# Patient Record
Sex: Female | Born: 1960 | Race: White | Hispanic: No | Marital: Married | Smoking: Former smoker
Health system: Southern US, Community
[De-identification: ages and names within clinical notes are randomized; demographics above are authoritative.]

## PROBLEM LIST (undated history)

## (undated) DIAGNOSIS — Z9109 Other allergy status, other than to drugs and biological substances: Secondary | ICD-10-CM

## (undated) DIAGNOSIS — N83209 Unspecified ovarian cyst, unspecified side: Secondary | ICD-10-CM

## (undated) DIAGNOSIS — M1990 Unspecified osteoarthritis, unspecified site: Secondary | ICD-10-CM

## (undated) DIAGNOSIS — IMO0002 Reserved for concepts with insufficient information to code with codable children: Secondary | ICD-10-CM

## (undated) HISTORY — PX: PELVIC LAPAROSCOPY: SHX162

## (undated) HISTORY — PX: NASAL SINUS SURGERY: SHX719

## (undated) HISTORY — DX: Unspecified osteoarthritis, unspecified site: M19.90

## (undated) HISTORY — PX: COSMETIC SURGERY: SHX468

## (undated) HISTORY — DX: Other allergy status, other than to drugs and biological substances: Z91.09

## (undated) HISTORY — DX: Unspecified ovarian cyst, unspecified side: N83.209

## (undated) HISTORY — PX: MRI: SHX5353

## (undated) HISTORY — PX: LEG SURGERY: SHX1003

## (undated) HISTORY — DX: Reserved for concepts with insufficient information to code with codable children: IMO0002

---

## 1999-05-25 ENCOUNTER — Other Ambulatory Visit: Admission: RE | Admit: 1999-05-25 | Discharge: 1999-05-25 | Payer: Self-pay | Admitting: Gynecology

## 2000-07-21 ENCOUNTER — Other Ambulatory Visit: Admission: RE | Admit: 2000-07-21 | Discharge: 2000-07-21 | Payer: Self-pay | Admitting: Obstetrics and Gynecology

## 2001-09-27 ENCOUNTER — Other Ambulatory Visit: Admission: RE | Admit: 2001-09-27 | Discharge: 2001-09-27 | Payer: Self-pay | Admitting: Obstetrics and Gynecology

## 2002-01-16 ENCOUNTER — Other Ambulatory Visit: Admission: RE | Admit: 2002-01-16 | Discharge: 2002-01-16 | Payer: Self-pay | Admitting: Obstetrics and Gynecology

## 2004-09-29 ENCOUNTER — Other Ambulatory Visit: Admission: RE | Admit: 2004-09-29 | Discharge: 2004-09-29 | Payer: Self-pay | Admitting: Obstetrics and Gynecology

## 2005-04-12 ENCOUNTER — Other Ambulatory Visit: Admission: RE | Admit: 2005-04-12 | Discharge: 2005-04-12 | Payer: Self-pay | Admitting: Obstetrics and Gynecology

## 2005-10-11 ENCOUNTER — Other Ambulatory Visit: Admission: RE | Admit: 2005-10-11 | Discharge: 2005-10-11 | Payer: Self-pay | Admitting: Obstetrics and Gynecology

## 2005-10-22 ENCOUNTER — Ambulatory Visit (HOSPITAL_COMMUNITY): Admission: RE | Admit: 2005-10-22 | Discharge: 2005-10-22 | Payer: Self-pay | Admitting: Obstetrics and Gynecology

## 2006-10-18 ENCOUNTER — Other Ambulatory Visit: Admission: RE | Admit: 2006-10-18 | Discharge: 2006-10-18 | Payer: Self-pay | Admitting: Obstetrics and Gynecology

## 2007-03-29 ENCOUNTER — Encounter: Admission: RE | Admit: 2007-03-29 | Discharge: 2007-03-29 | Payer: Self-pay | Admitting: Obstetrics and Gynecology

## 2008-07-16 ENCOUNTER — Other Ambulatory Visit: Admission: RE | Admit: 2008-07-16 | Discharge: 2008-07-16 | Payer: Self-pay | Admitting: Obstetrics and Gynecology

## 2008-07-16 ENCOUNTER — Encounter: Payer: Self-pay | Admitting: Obstetrics and Gynecology

## 2008-07-16 ENCOUNTER — Ambulatory Visit: Payer: Self-pay | Admitting: Obstetrics and Gynecology

## 2009-07-22 ENCOUNTER — Other Ambulatory Visit: Admission: RE | Admit: 2009-07-22 | Discharge: 2009-07-22 | Payer: Self-pay | Admitting: Obstetrics and Gynecology

## 2009-07-22 ENCOUNTER — Ambulatory Visit: Payer: Self-pay | Admitting: Obstetrics and Gynecology

## 2010-07-30 ENCOUNTER — Encounter
Admission: RE | Admit: 2010-07-30 | Discharge: 2010-07-30 | Payer: Self-pay | Source: Home / Self Care | Attending: Sports Medicine | Admitting: Sports Medicine

## 2010-08-04 ENCOUNTER — Encounter
Admission: RE | Admit: 2010-08-04 | Discharge: 2010-08-04 | Payer: Self-pay | Source: Home / Self Care | Attending: Obstetrics and Gynecology | Admitting: Obstetrics and Gynecology

## 2010-08-18 ENCOUNTER — Ambulatory Visit
Admission: RE | Admit: 2010-08-18 | Discharge: 2010-08-18 | Payer: Self-pay | Source: Home / Self Care | Attending: Obstetrics and Gynecology | Admitting: Obstetrics and Gynecology

## 2010-08-18 ENCOUNTER — Other Ambulatory Visit
Admission: RE | Admit: 2010-08-18 | Discharge: 2010-08-18 | Payer: Self-pay | Source: Home / Self Care | Admitting: Obstetrics and Gynecology

## 2010-08-18 ENCOUNTER — Other Ambulatory Visit: Payer: Self-pay | Admitting: Obstetrics and Gynecology

## 2010-08-22 ENCOUNTER — Other Ambulatory Visit: Payer: Self-pay | Admitting: Sports Medicine

## 2010-08-22 DIAGNOSIS — M25561 Pain in right knee: Secondary | ICD-10-CM

## 2010-10-03 ENCOUNTER — Ambulatory Visit
Admission: RE | Admit: 2010-10-03 | Discharge: 2010-10-03 | Disposition: A | Discharge status: Home / Self Care | Payer: Self-pay | Source: Ambulatory Visit | Attending: Sports Medicine | Admitting: Sports Medicine

## 2010-10-03 DIAGNOSIS — M25561 Pain in right knee: Secondary | ICD-10-CM

## 2011-08-27 ENCOUNTER — Other Ambulatory Visit: Payer: Self-pay | Admitting: Obstetrics and Gynecology

## 2011-08-27 DIAGNOSIS — Z1231 Encounter for screening mammogram for malignant neoplasm of breast: Secondary | ICD-10-CM

## 2011-08-31 ENCOUNTER — Ambulatory Visit
Admission: RE | Admit: 2011-08-31 | Discharge: 2011-08-31 | Disposition: A | Discharge status: Home / Self Care | Payer: Self-pay | Source: Ambulatory Visit | Attending: Obstetrics and Gynecology | Admitting: Obstetrics and Gynecology

## 2011-08-31 DIAGNOSIS — Z1231 Encounter for screening mammogram for malignant neoplasm of breast: Secondary | ICD-10-CM

## 2011-09-23 ENCOUNTER — Encounter: Payer: Self-pay | Admitting: Gynecology

## 2011-09-23 DIAGNOSIS — N83209 Unspecified ovarian cyst, unspecified side: Secondary | ICD-10-CM | POA: Insufficient documentation

## 2011-09-23 DIAGNOSIS — M1990 Unspecified osteoarthritis, unspecified site: Secondary | ICD-10-CM | POA: Insufficient documentation

## 2011-09-30 ENCOUNTER — Encounter: Payer: Self-pay | Admitting: Obstetrics and Gynecology

## 2011-09-30 ENCOUNTER — Other Ambulatory Visit (HOSPITAL_COMMUNITY)
Admission: RE | Admit: 2011-09-30 | Discharge: 2011-09-30 | Disposition: A | Discharge status: Home / Self Care | Payer: BC Managed Care – PPO | Source: Ambulatory Visit | Attending: Obstetrics and Gynecology | Admitting: Obstetrics and Gynecology

## 2011-09-30 ENCOUNTER — Ambulatory Visit (INDEPENDENT_AMBULATORY_CARE_PROVIDER_SITE_OTHER): Payer: BC Managed Care – PPO | Admitting: Obstetrics and Gynecology

## 2011-09-30 VITALS — BP 118/74 | Ht 67.0 in | Wt 108.0 lb

## 2011-09-30 DIAGNOSIS — Z01419 Encounter for gynecological examination (general) (routine) without abnormal findings: Secondary | ICD-10-CM

## 2011-09-30 DIAGNOSIS — S83209A Unspecified tear of unspecified meniscus, current injury, unspecified knee, initial encounter: Secondary | ICD-10-CM | POA: Insufficient documentation

## 2011-09-30 MED ORDER — AZITHROMYCIN 250 MG PO TABS: ORAL_TABLET | ORAL | Status: AC

## 2011-09-30 MED ORDER — NORETHINDRONE 0.35 MG PO TABS: ORAL_TABLET | ORAL | Status: DC

## 2011-09-30 NOTE — Progress Notes (Signed)
Patient came to see me today for her annual GYN exam. She remains on active continuous birth control pills. She has no bleeding. She is doing this both for period Control and birth control. She has been out of medication and is not taking any for 10 days but has not bled or had any symptoms. She is up-to-date on mammograms. She is now suffering from acute sinusitis with pain and nasal discharge. She is having no pelvic pain.  Physical examination: Kennon Portela present. HEENT within normal limits. Neck: Thyroid not large. No masses. Supraclavicular nodes: not enlarged. Breasts: Examined in both sitting midline position. No skin changes and no masses. Abdomen: Soft no guarding rebound or masses or hernia. Pelvic: External: Within normal limits. BUS: Within normal limits. Vaginal:within normal limits. Good estrogen effect. No evidence of cystocele rectocele or enterocele. Cervix: clean. Uterus: Normal size and shape. Adnexa: No masses. Rectovaginal exam: Confirmatory and negative. Extremities: Within normal limits.  Assessment: Normal GYN exam. Sinusitis.  Plan: Reinitiate OCPs. Take 1 daily of active continuous pills. Use condoms for birth control the first cycle. Z-Pak written for. Continue yearly mammograms. Patient wanted Sudafed but didn't know what dose she used. She will call with dose and we will give her 30 with 12 refills.

## 2011-10-01 LAB — CBC WITH DIFFERENTIAL/PLATELET
Eosinophils Absolute: 0.3 10*3/uL (ref 0.0–0.7)
Hemoglobin: 13.8 g/dL (ref 12.0–15.0)
Lymphocytes Relative: 22 % (ref 12–46)
Lymphs Abs: 1.9 10*3/uL (ref 0.7–4.0)
MCH: 32.5 pg (ref 26.0–34.0)
MCV: 99.3 fL (ref 78.0–100.0)
Monocytes Relative: 9 % (ref 3–12)
Neutrophils Relative %: 66 % (ref 43–77)
RBC: 4.25 MIL/uL (ref 3.87–5.11)

## 2011-10-01 LAB — URINALYSIS W MICROSCOPIC + REFLEX CULTURE
Protein, ur: NEGATIVE mg/dL
Urobilinogen, UA: 0.2 mg/dL (ref 0.0–1.0)

## 2011-10-01 LAB — CHOLESTEROL, TOTAL: Cholesterol: 191 mg/dL (ref 0–200)

## 2011-10-03 LAB — URINE CULTURE: Colony Count: 50000

## 2011-10-06 ENCOUNTER — Other Ambulatory Visit: Payer: Self-pay

## 2011-10-06 DIAGNOSIS — N390 Urinary tract infection, site not specified: Secondary | ICD-10-CM

## 2011-10-06 MED ORDER — NITROFURANTOIN MONOHYD MACRO 100 MG PO CAPS: 100.0000 mg | ORAL_CAPSULE | Freq: Two times a day (BID) | ORAL | Status: AC

## 2012-03-20 IMAGING — CR DG KNEE COMPLETE 4+V*R*
4 series · 4 of 4 positions shown · non-contrast
Comparison: None.

CLINICAL DATA: Right knee pain.  Swelling.

RIGHT KNEE - COMPLETE 4+ VIEW

[view not recorded (1 of 4)]
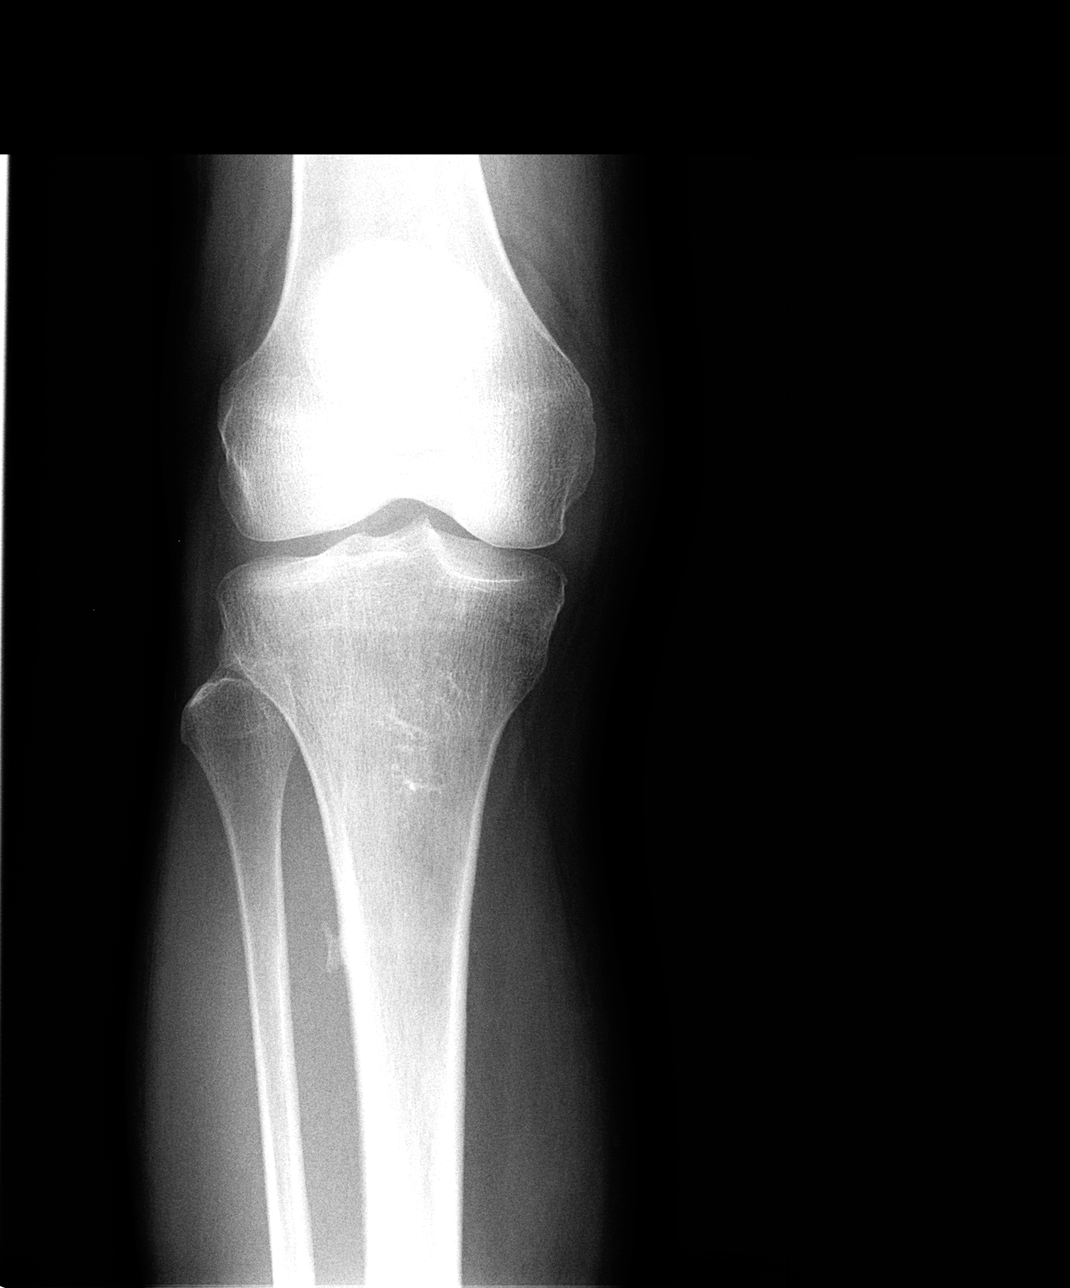

[view not recorded (2 of 4)]
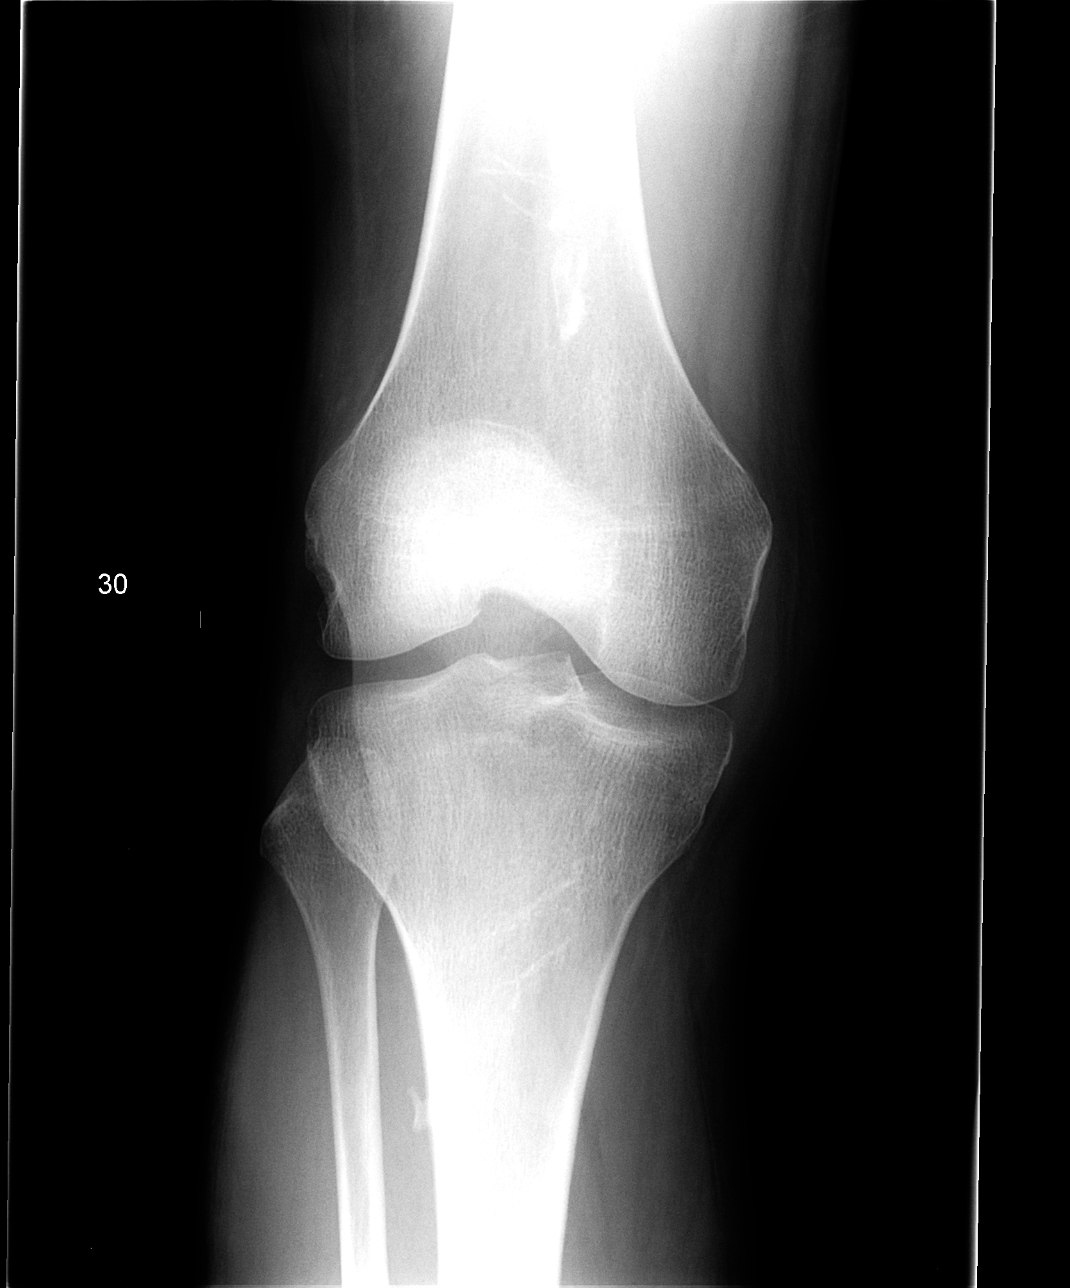

[view not recorded (3 of 4)]
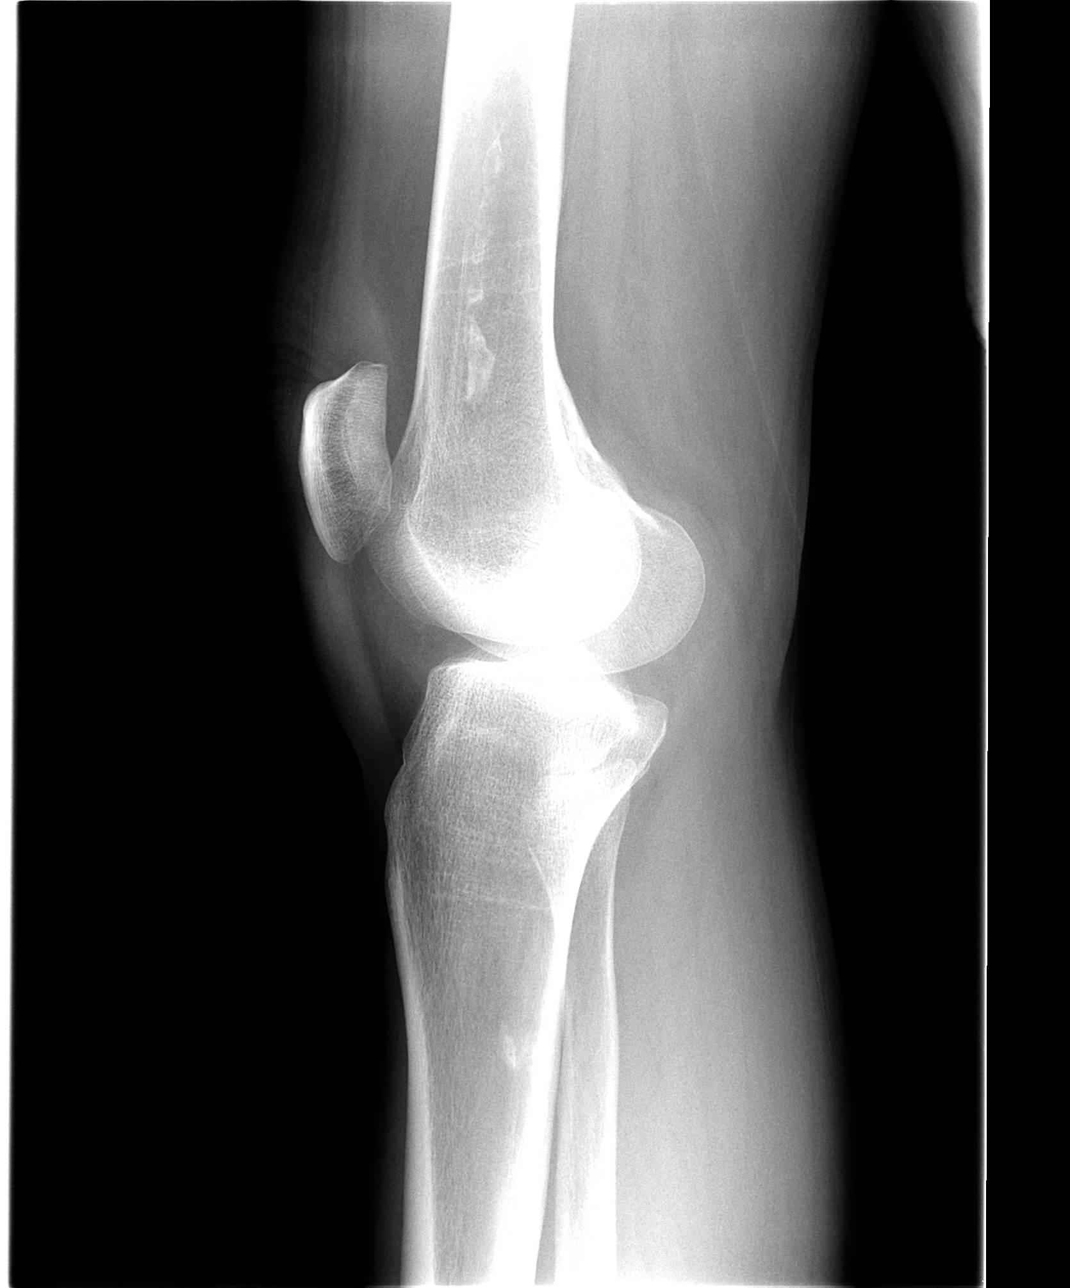

[view not recorded (4 of 4)]
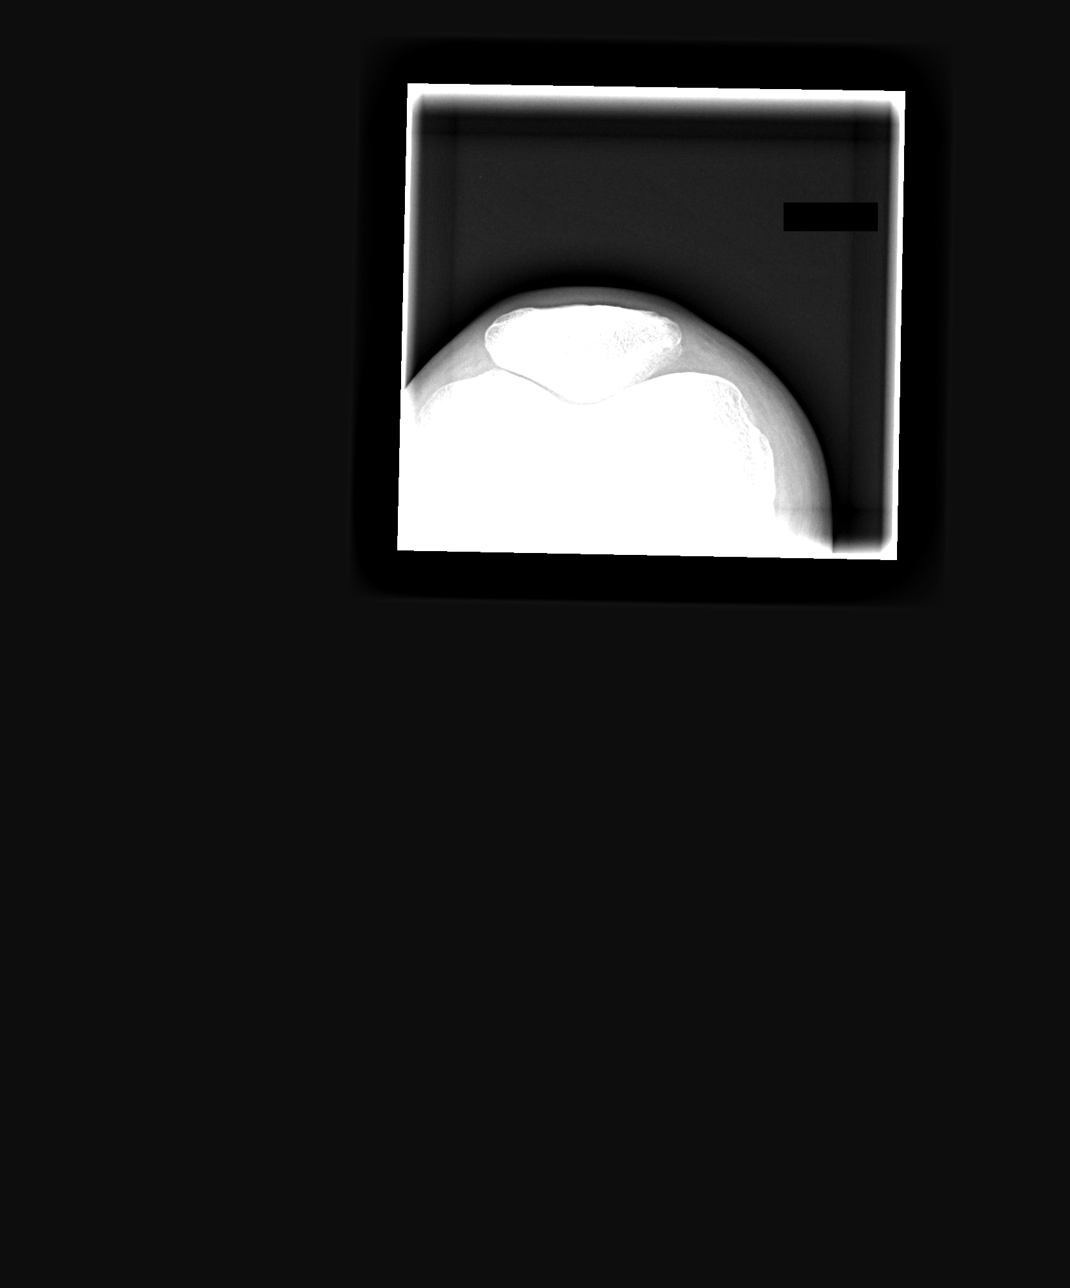

[4 of 4 positions shown; findings below may reference images not displayed]

FINDINGS: There are no erosive or destructive changes.  The joint
space is normally maintained.  There is no evidence for joint
effusion.  There is sclerotic change seen within the distal femoral
shaft and proximal tibia which may be seen secondary to old areas
of previous intramedullary infarction.
IMPRESSION: No acute findings.  Sclerotic areas within the distal right femur
and proximal right tibia most likely secondary to areas of previous
intramedullary infarction.

## 2012-10-04 ENCOUNTER — Other Ambulatory Visit: Payer: Self-pay | Admitting: Obstetrics and Gynecology

## 2012-10-16 ENCOUNTER — Encounter: Payer: Self-pay | Admitting: Women's Health

## 2012-10-16 ENCOUNTER — Ambulatory Visit: Payer: BC Managed Care – PPO | Admitting: Women's Health

## 2012-10-16 VITALS — BP 120/78 | Ht 66.0 in | Wt 111.0 lb

## 2012-10-16 DIAGNOSIS — IMO0001 Reserved for inherently not codable concepts without codable children: Secondary | ICD-10-CM

## 2012-10-16 DIAGNOSIS — E079 Disorder of thyroid, unspecified: Secondary | ICD-10-CM

## 2012-10-16 DIAGNOSIS — Z01419 Encounter for gynecological examination (general) (routine) without abnormal findings: Secondary | ICD-10-CM

## 2012-10-16 DIAGNOSIS — Z833 Family history of diabetes mellitus: Secondary | ICD-10-CM

## 2012-10-16 LAB — CBC WITH DIFFERENTIAL/PLATELET
Basophils Absolute: 0 10*3/uL (ref 0.0–0.1)
Basophils Relative: 0 % (ref 0–1)
HCT: 40.1 % (ref 36.0–46.0)
Lymphocytes Relative: 20 % (ref 12–46)
Neutro Abs: 6.8 10*3/uL (ref 1.7–7.7)
Neutrophils Relative %: 67 % (ref 43–77)
Platelets: 478 10*3/uL — ABNORMAL HIGH (ref 150–400)
RDW: 13.9 % (ref 11.5–15.5)
WBC: 10.4 10*3/uL (ref 4.0–10.5)

## 2012-10-16 MED ORDER — NORETHINDRONE 0.35 MG PO TABS: 1.0000 | ORAL_TABLET | Freq: Every day | ORAL | Status: DC

## 2012-10-16 NOTE — Progress Notes (Signed)
Summer N Spinney 1948-09-30 [id number]    History:    The patient presents for annual exam.  Rare bleeding on Micronor without complaint. Denies menopausal symptoms. History of normal 2 mammograms. History of ascus 2003 with negative colposcopy and biopsy. Normal Paps after   Past medical history, past surgical history, family history and social history were all reviewed and documented in the EPIC chart. 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 [place] 17 doing well. Normal lipid panel past year. Parents hypertension. Arthritis.   ROS:  A  ROS was performed and pertinent positives and negatives are included in the history.  Exam:  Filed Vitals:   10/16/12 1152  BP: 120/78    General appearance:  Normal Head/Neck:  Normal, without cervical or supraclavicular adenopathy. Thyroid:  Symmetrical, normal in size, without palpable masses or nodularity. Respiratory  Effort:  Normal  Auscultation:  Clear without wheezing or rhonchi Cardiovascular  Auscultation:  Regular rate, without rubs, murmurs or gallops  Edema/varicosities:  Not grossly evident Abdominal  Soft,nontender, without masses, guarding or rebound.  Liver/spleen:  No organomegaly noted  Hernia:  None appreciated  Skin  Inspection:  Grossly normal  Palpation:  Grossly normal Neurologic/psychiatric  Orientation:  Normal with appropriate conversation.  Mood/affect:  Normal  Genitourinary    Breasts: Examined lying and sitting.     Right: Without masses, retractions, discharge or axillary adenopathy.     Left: Without masses, retractions, discharge or axillary adenopathy.   Inguinal/mons:  Normal without inguinal adenopathy  External genitalia:  Normal  BUS/Urethra/Skene's glands:  Normal  Bladder:  Normal  Vagina:  Normal  Cervix:  Normal  Uterus:   normal in size, shape and contour.  Midline and mobile  Adnexa/parametria:     Rt: Without masses or tenderness.   Lt: Without masses or tenderness.  Anus and  perineum: Normal  Digital rectal exam: Normal sphincter tone without palpated masses or tenderness  Assessment/Plan:  52 y.o. MWF G7P2 for annual exam with no complaints.  Normal GYN exam on Micronor with rare bleeding Ascus with negative colposcopy and biopsy 2003 normal Paps after  Plan: SBE's, continue annual mammogram, calcium rich diet, vitamin D 1000 daily and regular exercise encouraged. Micronor prescription, proper use given and reviewed. CBC, glucose, UA, normal lipid panel 2013, Pap normal 10/2011, new screening guidelines reviewed.    Harrington Challenger WHNP, 1:26 PM 10/16/2012

## 2012-10-16 NOTE — Patient Instructions (Signed)

## 2012-10-17 LAB — URINALYSIS W MICROSCOPIC + REFLEX CULTURE
Bacteria, UA: NONE SEEN
Hgb urine dipstick: NEGATIVE
Ketones, ur: NEGATIVE mg/dL
Leukocytes, UA: NEGATIVE
Nitrite: NEGATIVE
Protein, ur: NEGATIVE mg/dL

## 2012-10-17 LAB — GLUCOSE, RANDOM: Glucose, Bld: 61 mg/dL — ABNORMAL LOW (ref 70–99)

## 2012-10-17 LAB — TSH: TSH: 0.746 u[IU]/mL (ref 0.350–4.500)

## 2012-10-19 ENCOUNTER — Other Ambulatory Visit: Payer: Self-pay | Admitting: Women's Health

## 2012-10-19 DIAGNOSIS — R7989 Other specified abnormal findings of blood chemistry: Secondary | ICD-10-CM

## 2012-11-09 ENCOUNTER — Other Ambulatory Visit: Payer: Self-pay | Admitting: Obstetrics and Gynecology

## 2012-11-09 DIAGNOSIS — Z1231 Encounter for screening mammogram for malignant neoplasm of breast: Secondary | ICD-10-CM

## 2012-11-14 ENCOUNTER — Ambulatory Visit (INDEPENDENT_AMBULATORY_CARE_PROVIDER_SITE_OTHER): Payer: BC Managed Care – PPO

## 2012-11-14 DIAGNOSIS — Z1231 Encounter for screening mammogram for malignant neoplasm of breast: Secondary | ICD-10-CM

## 2012-11-16 ENCOUNTER — Other Ambulatory Visit: Payer: BC Managed Care – PPO

## 2012-11-16 DIAGNOSIS — R7989 Other specified abnormal findings of blood chemistry: Secondary | ICD-10-CM

## 2012-11-17 LAB — CBC WITH DIFFERENTIAL/PLATELET
Basophils Absolute: 0 10*3/uL (ref 0.0–0.1)
Eosinophils Relative: 1 % (ref 0–5)
HCT: 38.4 % (ref 36.0–46.0)
Lymphocytes Relative: 34 % (ref 12–46)
Lymphs Abs: 3.6 10*3/uL (ref 0.7–4.0)
MCV: 95.3 fL (ref 78.0–100.0)
Monocytes Absolute: 0.8 10*3/uL (ref 0.1–1.0)
Neutro Abs: 6.1 10*3/uL (ref 1.7–7.7)
Platelets: 525 10*3/uL — ABNORMAL HIGH (ref 150–400)
RBC: 4.03 MIL/uL (ref 3.87–5.11)
RDW: 13.9 % (ref 11.5–15.5)
WBC: 10.5 10*3/uL (ref 4.0–10.5)

## 2012-11-22 ENCOUNTER — Telehealth: Payer: Self-pay | Admitting: Oncology

## 2012-11-22 ENCOUNTER — Telehealth: Payer: Self-pay

## 2012-11-22 ENCOUNTER — Telehealth: Payer: Self-pay | Admitting: *Deleted

## 2012-11-22 NOTE — Telephone Encounter (Signed)
S/W PT IN RE TO NP APPT 05/20 @ 10:30 W/DR. SHADAD. REFERRING NANCY YOUNG, NP DX-ELEVATED PLTS WELCOME PACKET MAILED.

## 2012-11-22 NOTE — Telephone Encounter (Signed)
Message copied by Aura Camps on Wed Nov 22, 2012  9:42 AM ------      Message from: Keenan Bachelor      Created: Tue Nov 21, 2012  4:48 PM      Regarding: Referral Hematologist       Please call and inform platelets are now a little higher, will need followup with hematologist. Dr Myna Hidalgo or any of the others is ok.  Review probably nothing , but best to have checked. (She is healthy with no known medical problems)               Victorino Dike, I left patient detailed message with above and told her you would be in touch with her regarding hematologist appt.            Thanks!!!!!!!!!!! ------

## 2012-11-22 NOTE — Telephone Encounter (Signed)
LVOM FOR PT TO RETURN CALL.  °

## 2012-11-22 NOTE — Telephone Encounter (Signed)
Referral placed to [facility].

## 2012-11-22 NOTE — Telephone Encounter (Signed)
Message left

## 2012-11-22 NOTE — Telephone Encounter (Signed)
Patient was informed regarding platelets still being elevated and was reassured.  Hematology appt is May 20th but she would still would like to talk with you.

## 2012-11-22 NOTE — Telephone Encounter (Signed)
appt 12/19/12

## 2012-11-23 NOTE — Telephone Encounter (Signed)
Patient had called back, message left,

## 2012-11-24 NOTE — Telephone Encounter (Signed)
Telephone call to review platelets. Informed me that she is a vegetarian, eats minimal dairy other than occasional cheese. Encouraged to start B complex vitamin supplement daily and inform hematologist at followup appointment for elevated platelets.

## 2012-11-28 ENCOUNTER — Telehealth: Payer: Self-pay | Admitting: Oncology

## 2012-11-28 NOTE — Telephone Encounter (Signed)
C/D 11/28/12 for appt. 12/19/12

## 2012-12-06 ENCOUNTER — Telehealth: Payer: Self-pay | Admitting: Women's Health

## 2012-12-06 DIAGNOSIS — D691 Qualitative platelet defects: Secondary | ICD-10-CM

## 2012-12-06 NOTE — Telephone Encounter (Signed)
Telephone call from patient. Has had increased platelets. Remembered that she had taken 2 weeks of prednisone prior to her office appointment/sinus infection and questions if that may have elevated her platelets. Has a scheduled hematologist appointment on may the 20th and is requesting to repeat her CBC prior. We'll recheck CBC, triage based on those results, switched to appointments. Reviewed steroids may increase platelets.

## 2012-12-13 ENCOUNTER — Other Ambulatory Visit: Payer: Self-pay | Admitting: Women's Health

## 2012-12-13 ENCOUNTER — Other Ambulatory Visit: Payer: BC Managed Care – PPO

## 2012-12-13 DIAGNOSIS — D691 Qualitative platelet defects: Secondary | ICD-10-CM

## 2012-12-13 LAB — CBC WITH DIFFERENTIAL/PLATELET
HCT: 36.1 % (ref 36.0–46.0)
Hemoglobin: 12.7 g/dL (ref 12.0–15.0)
Lymphs Abs: 2.1 10*3/uL (ref 0.7–4.0)
MCHC: 35.2 g/dL (ref 30.0–36.0)
Monocytes Absolute: 0.5 10*3/uL (ref 0.1–1.0)
Monocytes Relative: 8 % (ref 3–12)
Neutro Abs: 3.8 10*3/uL (ref 1.7–7.7)
Neutrophils Relative %: 58 % (ref 43–77)
RBC: 3.86 MIL/uL — ABNORMAL LOW (ref 3.87–5.11)

## 2012-12-14 ENCOUNTER — Other Ambulatory Visit: Payer: Self-pay | Admitting: Women's Health

## 2012-12-14 DIAGNOSIS — D691 Qualitative platelet defects: Secondary | ICD-10-CM

## 2012-12-15 ENCOUNTER — Other Ambulatory Visit: Payer: Self-pay | Admitting: Oncology

## 2012-12-15 DIAGNOSIS — D75839 Thrombocytosis, unspecified: Secondary | ICD-10-CM

## 2012-12-15 DIAGNOSIS — D473 Essential (hemorrhagic) thrombocythemia: Secondary | ICD-10-CM

## 2012-12-19 ENCOUNTER — Ambulatory Visit: Payer: BC Managed Care – PPO | Admitting: Oncology

## 2012-12-19 ENCOUNTER — Ambulatory Visit: Payer: BC Managed Care – PPO

## 2012-12-19 ENCOUNTER — Other Ambulatory Visit: Payer: BC Managed Care – PPO | Admitting: Lab

## 2013-06-07 ENCOUNTER — Other Ambulatory Visit: Payer: Self-pay

## 2013-10-17 ENCOUNTER — Ambulatory Visit (INDEPENDENT_AMBULATORY_CARE_PROVIDER_SITE_OTHER): Payer: BC Managed Care – PPO | Admitting: Women's Health

## 2013-10-17 ENCOUNTER — Encounter: Payer: Self-pay | Admitting: Women's Health

## 2013-10-17 ENCOUNTER — Other Ambulatory Visit (HOSPITAL_COMMUNITY)
Admission: RE | Admit: 2013-10-17 | Discharge: 2013-10-17 | Disposition: A | Discharge status: Home / Self Care | Payer: BC Managed Care – PPO | Source: Ambulatory Visit | Attending: Women's Health | Admitting: Women's Health

## 2013-10-17 VITALS — BP 110/70 | Ht 65.5 in | Wt 118.0 lb

## 2013-10-17 DIAGNOSIS — Z01419 Encounter for gynecological examination (general) (routine) without abnormal findings: Secondary | ICD-10-CM

## 2013-10-17 DIAGNOSIS — D691 Qualitative platelet defects: Secondary | ICD-10-CM

## 2013-10-17 DIAGNOSIS — D75839 Thrombocytosis, unspecified: Secondary | ICD-10-CM

## 2013-10-17 DIAGNOSIS — R87810 Cervical high risk human papillomavirus (HPV) DNA test positive: Secondary | ICD-10-CM | POA: Insufficient documentation

## 2013-10-17 DIAGNOSIS — Z309 Encounter for contraceptive management, unspecified: Secondary | ICD-10-CM

## 2013-10-17 DIAGNOSIS — D473 Essential (hemorrhagic) thrombocythemia: Secondary | ICD-10-CM

## 2013-10-17 DIAGNOSIS — Z1151 Encounter for screening for human papillomavirus (HPV): Secondary | ICD-10-CM | POA: Insufficient documentation

## 2013-10-17 DIAGNOSIS — IMO0001 Reserved for inherently not codable concepts without codable children: Secondary | ICD-10-CM

## 2013-10-17 DIAGNOSIS — Z124 Encounter for screening for malignant neoplasm of cervix: Secondary | ICD-10-CM | POA: Insufficient documentation

## 2013-10-17 LAB — CBC WITH DIFFERENTIAL/PLATELET
BASOS ABS: 0 10*3/uL (ref 0.0–0.1)
Basophils Relative: 0 % (ref 0–1)
Eosinophils Absolute: 0.1 10*3/uL (ref 0.0–0.7)
Eosinophils Relative: 1 % (ref 0–5)
HEMATOCRIT: 37.9 % (ref 36.0–46.0)
HEMOGLOBIN: 13.2 g/dL (ref 12.0–15.0)
LYMPHS PCT: 32 % (ref 12–46)
Lymphs Abs: 2.2 10*3/uL (ref 0.7–4.0)
MCH: 32.4 pg (ref 26.0–34.0)
MCHC: 34.8 g/dL (ref 30.0–36.0)
MCV: 92.9 fL (ref 78.0–100.0)
MONO ABS: 0.7 10*3/uL (ref 0.1–1.0)
MONOS PCT: 10 % (ref 3–12)
NEUTROS ABS: 3.9 10*3/uL (ref 1.7–7.7)
NEUTROS PCT: 57 % (ref 43–77)
Platelets: 405 10*3/uL — ABNORMAL HIGH (ref 150–400)
RBC: 4.08 MIL/uL (ref 3.87–5.11)
RDW: 13.3 % (ref 11.5–15.5)
WBC: 6.8 10*3/uL (ref 4.0–10.5)

## 2013-10-17 MED ORDER — NORETHINDRONE 0.35 MG PO TABS: 1.0000 | ORAL_TABLET | Freq: Every day | ORAL | Status: DC

## 2013-10-17 NOTE — Progress Notes (Signed)
Shelley Valencia [birth year] [id number]    History:    Presents for annual exam.  Rare spotting on Micronor with no menopausal symptoms. Mother and sisters menopause middle 52s. 2003 ascus with negative colposcopy. Has not had a colonoscopy. Normal mammogram history. RA.  Past medical history, past surgical history, family history and social history were all reviewed and documented in the EPIC chart. 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 Shelley Valencia 17 doing well. Parents hypertension. Sister cervical cancer.  ROS:  A  ROS was performed and pertinent positives and negatives are included.  Exam:  Filed Vitals:   10/17/13 1437  BP: 110/70    General appearance:  Normal Thyroid:  Symmetrical, normal in size, without palpable masses or nodularity. Respiratory  Auscultation:  Clear without wheezing or rhonchi Cardiovascular  Auscultation:  Regular rate, without rubs, murmurs or gallops  Edema/varicosities:  Not grossly evident Abdominal  Soft,nontender, without masses, guarding or rebound.  Liver/spleen:  No organomegaly noted  Hernia:  None appreciated  Skin  Inspection:  Grossly normal   Breasts: Examined lying and sitting.     Right: Without masses, retractions, discharge or axillary adenopathy.     Left: Without masses, retractions, discharge or axillary adenopathy. Gentitourinary   Inguinal/mons:  Normal without inguinal adenopathy  External genitalia:  Normal  BUS/Urethra/Skene's glands:  Normal  Vagina:  Normal  Cervix:  Normal  Uterus:   normal in size, shape and contour.  Midline and mobile  Adnexa/parametria:     Rt: Without masses or tenderness.   Lt: Without masses or tenderness.  Anus and perineum: Normal  Digital rectal exam: Normal sphincter tone without palpated masses or tenderness  Assessment/Plan:  53 y.o. MWF G2P2 for annual exam with no complaints.  2003 ascus with normal Paps after Arthritis Amenorrheic with Micronor  Plan: Micronor prescription, proper  use, slight risk for blood clots and strokes. SBE's, continue annual mammogram, 3-D tomography reviewed and encouraged history of dense breast. Continue active lifestyle, regular exercise, calcium rich diet,  vitamin D 2000 daily. CBC, UA, Pap. Pap normal 08/2011, new screening guidelines reviewed. Normal lipid panel and TSH 2014. Reviewed importance of screening colonoscopy instructed to schedule.  Shelley Valencia [facility], 3:37 PM 10/17/2013

## 2013-10-17 NOTE — Addendum Note (Signed)
Addended by: Alen Blew on: 10/17/2013 04:51 PM   Modules accepted: Orders

## 2013-10-18 ENCOUNTER — Encounter: Payer: Self-pay | Admitting: Women's Health

## 2013-10-18 LAB — URINALYSIS W MICROSCOPIC + REFLEX CULTURE
BACTERIA UA: NONE SEEN
Bilirubin Urine: NEGATIVE
Casts: NONE SEEN
Crystals: NONE SEEN
Glucose, UA: NEGATIVE mg/dL
HGB URINE DIPSTICK: NEGATIVE
KETONES UR: NEGATIVE mg/dL
Leukocytes, UA: NEGATIVE
NITRITE: NEGATIVE
Protein, ur: NEGATIVE mg/dL
Specific Gravity, Urine: 1.011 (ref 1.005–1.030)
Squamous Epithelial / LPF: NONE SEEN
UROBILINOGEN UA: 0.2 mg/dL (ref 0.0–1.0)
pH: 6.5 (ref 5.0–8.0)

## 2013-10-24 ENCOUNTER — Other Ambulatory Visit: Payer: Self-pay | Admitting: Women's Health

## 2013-10-24 DIAGNOSIS — Z1231 Encounter for screening mammogram for malignant neoplasm of breast: Secondary | ICD-10-CM

## 2013-10-31 DIAGNOSIS — IMO0002 Reserved for concepts with insufficient information to code with codable children: Secondary | ICD-10-CM

## 2013-10-31 HISTORY — DX: Reserved for concepts with insufficient information to code with codable children: IMO0002

## 2013-11-15 ENCOUNTER — Ambulatory Visit (INDEPENDENT_AMBULATORY_CARE_PROVIDER_SITE_OTHER): Payer: BC Managed Care – PPO

## 2013-11-15 DIAGNOSIS — Z1231 Encounter for screening mammogram for malignant neoplasm of breast: Secondary | ICD-10-CM

## 2013-11-18 ENCOUNTER — Encounter: Payer: Self-pay | Admitting: Women's Health

## 2013-11-21 ENCOUNTER — Ambulatory Visit (INDEPENDENT_AMBULATORY_CARE_PROVIDER_SITE_OTHER): Payer: BC Managed Care – PPO | Admitting: Gynecology

## 2013-11-21 ENCOUNTER — Encounter: Payer: Self-pay | Admitting: Gynecology

## 2013-11-21 DIAGNOSIS — R6889 Other general symptoms and signs: Secondary | ICD-10-CM

## 2013-11-21 DIAGNOSIS — IMO0002 Reserved for concepts with insufficient information to code with codable children: Secondary | ICD-10-CM

## 2013-11-21 NOTE — Addendum Note (Signed)
Addended by: Nelva Nay on: 11/21/2013 04:36 PM   Modules accepted: Orders

## 2013-11-21 NOTE — Patient Instructions (Signed)
Office will call you with the biopsy results 

## 2013-11-21 NOTE — Progress Notes (Signed)
Patient ID: Shelley Valencia, female   DOB: 07-06-1961, 53 y.o.   MRN: [id number] Shelley Valencia [birth year] [id number]        53 y.o.  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 Per Nancy's note she has a history of ASCUS in 2003 with negative Pap smears since then.  Past medical history,surgical history, problem list, medications, allergies, family history and social history were all reviewed and documented in the EPIC chart.  Directed ROS with pertinent positives and negatives documented in the history of present illness/assessment and plan.  Exam: [name] assistant General appearance  Normal Pelvic external BUS vagina normal. Cervix grossly normal. Uterus normal size midline mobile nontender. Adnexa without masses or tenderness.  Colposcopy after acetic acid cleanse is inadequate. Transformation zone not seen in its entirety. No abnormalities were seen. Patient has a main cervical os and a smaller dimpled os to the left. Probing with a Cytobrush showed communication with the other main loss/canal. ECC sampling of both openings obtained.Physical Exam  Genitourinary:       Assessment/Plan:  53 y.o. 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 No significant history prior. Colposcopy was normal but inadequate . Has a dimpling cervical opening lateral to the main cervical os. Suspect secondary to obstetrical trauma/healing. Sampling of both areas taken at [facility]. Patient will followup for results. If negative then plan expectant management with repeat Pap/HPV in 1 year. If otherwise then we will triage based on results.    Note: This document was prepared with digital dictation and possible smart phrase technology. Any transcriptional errors that result from this process are unintentional.   Anastasio Auerbach MD, 4:23 PM 11/21/2013

## 2013-11-26 ENCOUNTER — Encounter: Payer: Self-pay | Admitting: Gynecology

## 2014-06-03 ENCOUNTER — Encounter: Payer: Self-pay | Admitting: Gynecology

## 2014-10-16 ENCOUNTER — Other Ambulatory Visit: Payer: Self-pay | Admitting: Women's Health

## 2014-10-16 ENCOUNTER — Telehealth: Payer: Self-pay | Admitting: *Deleted

## 2014-10-16 ENCOUNTER — Other Ambulatory Visit: Payer: Self-pay

## 2014-10-16 DIAGNOSIS — Z1231 Encounter for screening mammogram for malignant neoplasm of breast: Secondary | ICD-10-CM

## 2014-10-16 NOTE — Telephone Encounter (Signed)
Pt called requesting name of place to have 3 D mammogram breast name and # given.

## 2014-10-24 ENCOUNTER — Encounter: Payer: Self-pay | Admitting: Women's Health

## 2014-10-24 ENCOUNTER — Ambulatory Visit (INDEPENDENT_AMBULATORY_CARE_PROVIDER_SITE_OTHER): Payer: BLUE CROSS/BLUE SHIELD | Admitting: Women's Health

## 2014-10-24 ENCOUNTER — Other Ambulatory Visit (HOSPITAL_COMMUNITY)
Admission: RE | Admit: 2014-10-24 | Discharge: 2014-10-24 | Disposition: A | Discharge status: Home / Self Care | Payer: BLUE CROSS/BLUE SHIELD | Source: Ambulatory Visit | Attending: Women's Health | Admitting: Women's Health

## 2014-10-24 VITALS — BP 112/72 | Ht 66.0 in | Wt 113.4 lb

## 2014-10-24 DIAGNOSIS — Z1151 Encounter for screening for human papillomavirus (HPV): Secondary | ICD-10-CM | POA: Diagnosis present

## 2014-10-24 DIAGNOSIS — Z01419 Encounter for gynecological examination (general) (routine) without abnormal findings: Secondary | ICD-10-CM | POA: Diagnosis present

## 2014-10-24 DIAGNOSIS — Z3041 Encounter for surveillance of contraceptive pills: Secondary | ICD-10-CM | POA: Diagnosis not present

## 2014-10-24 DIAGNOSIS — Z13220 Encounter for screening for lipoid disorders: Secondary | ICD-10-CM

## 2014-10-24 LAB — COMPREHENSIVE METABOLIC PANEL
ALBUMIN: 4.5 g/dL (ref 3.5–5.2)
ALT: 25 U/L (ref 0–35)
AST: 30 U/L (ref 0–37)
Alkaline Phosphatase: 54 U/L (ref 39–117)
BUN: 7 mg/dL (ref 6–23)
CALCIUM: 9.8 mg/dL (ref 8.4–10.5)
CHLORIDE: 96 meq/L (ref 96–112)
CO2: 27 meq/L (ref 19–32)
CREATININE: 0.65 mg/dL (ref 0.50–1.10)
GLUCOSE: 93 mg/dL (ref 70–99)
POTASSIUM: 4.4 meq/L (ref 3.5–5.3)
Sodium: 133 mEq/L — ABNORMAL LOW (ref 135–145)
Total Bilirubin: 0.8 mg/dL (ref 0.2–1.2)
Total Protein: 7.2 g/dL (ref 6.0–8.3)

## 2014-10-24 LAB — CBC WITH DIFFERENTIAL/PLATELET
BASOS ABS: 0 10*3/uL (ref 0.0–0.1)
BASOS PCT: 0 % (ref 0–1)
EOS PCT: 1 % (ref 0–5)
Eosinophils Absolute: 0.1 10*3/uL (ref 0.0–0.7)
HCT: 42.6 % (ref 36.0–46.0)
Hemoglobin: 14.3 g/dL (ref 12.0–15.0)
Lymphocytes Relative: 22 % (ref 12–46)
Lymphs Abs: 2.2 10*3/uL (ref 0.7–4.0)
MCH: 32.6 pg (ref 26.0–34.0)
MCHC: 33.6 g/dL (ref 30.0–36.0)
MCV: 97.3 fL (ref 78.0–100.0)
MONO ABS: 0.9 10*3/uL (ref 0.1–1.0)
MPV: 9.1 fL (ref 8.6–12.4)
Monocytes Relative: 9 % (ref 3–12)
Neutro Abs: 6.8 10*3/uL (ref 1.7–7.7)
Neutrophils Relative %: 68 % (ref 43–77)
PLATELETS: 439 10*3/uL — AB (ref 150–400)
RBC: 4.38 MIL/uL (ref 3.87–5.11)
RDW: 13.1 % (ref 11.5–15.5)
WBC: 10 10*3/uL (ref 4.0–10.5)

## 2014-10-24 LAB — LIPID PANEL
CHOL/HDL RATIO: 2.4 ratio
Cholesterol: 213 mg/dL — ABNORMAL HIGH (ref 0–200)
HDL: 89 mg/dL (ref >=46)
LDL CALC: 106 mg/dL — AB (ref 0–99)
TRIGLYCERIDES: 92 mg/dL (ref <150)
VLDL: 18 mg/dL (ref 0–40)

## 2014-10-24 MED ORDER — NORETHINDRONE 0.35 MG PO TABS: 1.0000 | ORAL_TABLET | Freq: Every day | ORAL | Status: DC

## 2014-10-24 NOTE — Patient Instructions (Signed)

## 2014-10-24 NOTE — Progress Notes (Signed)
Shelley Valencia [birth year] [id number]    History:    Presents for annual exam.  Rare light bleeding on Micronor with occasional hot flushes. 2015 Pap ascus with positive HR HPV with negative colposcopy. Normal mammogram history. Reports both mother and sister middle 27s with menopause. Has scheduled appointment to discuss colonoscopy.  Past medical history, past surgical history, family history and social history were all reviewed and documented in the EPIC chart. 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 Parents hypertension.  ROS:  A ROS was performed and pertinent positives and negatives are included.  Exam:  Filed Vitals:   10/24/14 1425  BP: 112/72    General appearance:  Normal Thyroid:  Symmetrical, normal in size, without palpable masses or nodularity. Respiratory  Auscultation:  Clear without wheezing or rhonchi Cardiovascular  Auscultation:  Regular rate, without rubs, murmurs or gallops  Edema/varicosities:  Not grossly evident Abdominal  Soft,nontender, without masses, guarding or rebound.  Liver/spleen:  No organomegaly noted  Hernia:  None appreciated  Skin  Inspection:  Grossly normal   Breasts: Examined lying and sitting.     Right: Without masses, retractions, discharge or axillary adenopathy.     Left: Without masses, retractions, discharge or axillary adenopathy. Gentitourinary   Inguinal/mons:  Normal without inguinal adenopathy  External genitalia:  Normal  BUS/Urethra/Skene's glands:  Normal  Vagina:  Normal  Cervix:  Normal  Uterus:   normal in size, shape and contour.  Midline and mobile  Adnexa/parametria:     Rt: Without masses or tenderness.   Lt: Without masses or tenderness.  Anus and perineum: Normal  Digital rectal exam: Normal sphincter tone without palpated masses or tenderness  Assessment/Plan:  54 y.o. M WF G2 P2 for annual exam with no complaints.  Light monthly cycle on Micronor 2015 positive HR HPV with  negative colposcopy  Plan: Micronor prescription, proper use, slight risk for blood clots and strokes,. Continue active lifestyle, regular exercise, vitamin D 2000 daily encouraged. SBE's, continue annual screening mammogram 3-D tomography reviewed and encouraged history of dense breasts. CBC, CMP, TSH, vitamin D, lipid panel, UA, Pap with HR HPV typing.  Huel Cote [facility], 5:08 PM 10/24/2014

## 2014-10-25 LAB — VITAMIN D 25 HYDROXY (VIT D DEFICIENCY, FRACTURES): Vit D, 25-Hydroxy: 37 ng/mL (ref 30–100)

## 2014-10-25 LAB — TSH: TSH: 0.499 u[IU]/mL (ref 0.350–4.500)

## 2014-10-30 LAB — CYTOLOGY - PAP

## 2014-11-20 ENCOUNTER — Ambulatory Visit (INDEPENDENT_AMBULATORY_CARE_PROVIDER_SITE_OTHER): Payer: BLUE CROSS/BLUE SHIELD

## 2014-11-20 ENCOUNTER — Encounter: Payer: Self-pay | Admitting: Women's Health

## 2014-11-20 DIAGNOSIS — Z1231 Encounter for screening mammogram for malignant neoplasm of breast: Secondary | ICD-10-CM | POA: Diagnosis not present

## 2015-10-13 ENCOUNTER — Other Ambulatory Visit: Payer: Self-pay | Admitting: Women's Health

## 2015-10-13 DIAGNOSIS — Z1231 Encounter for screening mammogram for malignant neoplasm of breast: Secondary | ICD-10-CM

## 2015-10-28 ENCOUNTER — Encounter: Payer: BLUE CROSS/BLUE SHIELD | Admitting: Women's Health

## 2015-11-05 ENCOUNTER — Ambulatory Visit (INDEPENDENT_AMBULATORY_CARE_PROVIDER_SITE_OTHER): Payer: BLUE CROSS/BLUE SHIELD | Admitting: Women's Health

## 2015-11-05 ENCOUNTER — Other Ambulatory Visit: Payer: Self-pay | Admitting: Women's Health

## 2015-11-05 ENCOUNTER — Encounter: Payer: Self-pay | Admitting: Women's Health

## 2015-11-05 VITALS — BP 115/78 | Ht 65.0 in | Wt 113.0 lb

## 2015-11-05 DIAGNOSIS — Z01419 Encounter for gynecological examination (general) (routine) without abnormal findings: Secondary | ICD-10-CM | POA: Diagnosis not present

## 2015-11-05 DIAGNOSIS — Z13220 Encounter for screening for lipoid disorders: Secondary | ICD-10-CM | POA: Diagnosis not present

## 2015-11-05 DIAGNOSIS — Z3041 Encounter for surveillance of contraceptive pills: Secondary | ICD-10-CM

## 2015-11-05 DIAGNOSIS — Z1329 Encounter for screening for other suspected endocrine disorder: Secondary | ICD-10-CM | POA: Diagnosis not present

## 2015-11-05 DIAGNOSIS — N912 Amenorrhea, unspecified: Secondary | ICD-10-CM

## 2015-11-05 LAB — COMPREHENSIVE METABOLIC PANEL
ALBUMIN: 4.5 g/dL (ref 3.6–5.1)
ALK PHOS: 72 U/L (ref 33–130)
ALT: 11 U/L (ref 6–29)
AST: 22 U/L (ref 10–35)
BUN: 6 mg/dL — ABNORMAL LOW (ref 7–25)
CALCIUM: 9.5 mg/dL (ref 8.6–10.4)
CHLORIDE: 100 mmol/L (ref 98–110)
CO2: 24 mmol/L (ref 20–31)
Creat: 0.65 mg/dL (ref 0.50–1.05)
Glucose, Bld: 90 mg/dL (ref 65–99)
POTASSIUM: 3.8 mmol/L (ref 3.5–5.3)
Sodium: 135 mmol/L (ref 135–146)
Total Bilirubin: 0.9 mg/dL (ref 0.2–1.2)
Total Protein: 7.5 g/dL (ref 6.1–8.1)

## 2015-11-05 LAB — CBC WITH DIFFERENTIAL/PLATELET
BASOS PCT: 0 %
Basophils Absolute: 0 cells/uL (ref 0–200)
EOS ABS: 88 {cells}/uL (ref 15–500)
Eosinophils Relative: 1 %
HEMATOCRIT: 42.5 % (ref 35.0–45.0)
HEMOGLOBIN: 14.2 g/dL (ref 11.7–15.5)
LYMPHS PCT: 25 %
Lymphs Abs: 2200 cells/uL (ref 850–3900)
MCH: 32.1 pg (ref 27.0–33.0)
MCHC: 33.4 g/dL (ref 32.0–36.0)
MCV: 96.2 fL (ref 80.0–100.0)
MONO ABS: 616 {cells}/uL (ref 200–950)
MPV: 9.1 fL (ref 7.5–12.5)
Monocytes Relative: 7 %
Neutro Abs: 5896 cells/uL (ref 1500–7800)
Neutrophils Relative %: 67 %
Platelets: 459 10*3/uL — ABNORMAL HIGH (ref 140–400)
RBC: 4.42 MIL/uL (ref 3.80–5.10)
RDW: 12.8 % (ref 11.0–15.0)
WBC: 8.8 10*3/uL (ref 3.8–10.8)

## 2015-11-05 LAB — FOLLICLE STIMULATING HORMONE: FSH: 64.2 m[IU]/mL

## 2015-11-05 LAB — LIPID PANEL
CHOL/HDL RATIO: 2.1 ratio (ref <=5.0)
Cholesterol: 207 mg/dL — ABNORMAL HIGH (ref 125–200)
HDL: 98 mg/dL (ref >=46)
LDL Cholesterol: 92 mg/dL (ref <130)
Triglycerides: 86 mg/dL (ref <150)
VLDL: 17 mg/dL (ref <30)

## 2015-11-05 MED ORDER — NORETHINDRONE 0.35 MG PO TABS: 1.0000 | ORAL_TABLET | Freq: Every day | ORAL | Status: AC

## 2015-11-05 NOTE — Progress Notes (Signed)
Shelley Valencia 26-Oct-1960 [id number]    History:    Presents for annual exam.  Amenorrheic on Micronor with minimal hot flushes. Normal mammogram history. 2015 Pap ascus with positive HR HPV with negative colposcopy, 2016 Pap normal with negative high risk HPV. 2016 negative colonoscopy.  Past medical history, past surgical history, family history and social history were all reviewed and documented in the EPIC chart. 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 Parents hypertension.  ROS:  A ROS was performed and pertinent positives and negatives are included.  Exam:  Filed Vitals:   11/05/15 1423  BP: 115/78    General appearance:  Normal Thyroid:  Symmetrical, normal in size, without palpable masses or nodularity. Respiratory  Auscultation:  Clear without wheezing or rhonchi Cardiovascular  Auscultation:  Regular rate, without rubs, murmurs or gallops  Edema/varicosities:  Not grossly evident Abdominal  Soft,nontender, without masses, guarding or rebound.  Liver/spleen:  No organomegaly noted  Hernia:  None appreciated  Skin  Inspection:  Grossly normal   Breasts: Examined lying and sitting.     Right: Without masses, retractions, discharge or axillary adenopathy.     Left: Without masses, retractions, discharge or axillary adenopathy. Gentitourinary   Inguinal/mons:  Normal without inguinal adenopathy  External genitalia:  Normal  BUS/Urethra/Skene's glands:  Normal  Vagina:  Normal  Cervix:  Normal  Uterus:  normal in size, shape and contour.  Midline and mobile  Adnexa/parametria:     Rt: Without masses or tenderness.   Lt: Without masses or tenderness.  Anus and perineum: Normal  Digital rectal exam: Normal sphincter tone without palpated masses or tenderness  Assessment/Plan:  55 y.o. MWF G2 P2 for annual exam with no complaints.  Amenorrheic on Micronor with minimal menopausal symptoms 2015 ascus with  positive HR HPV with negative colposcopy/2016 Pap normal with negative HR HPV  Plan: Micronor prescription, proper use given and reviewed slight risk for blood clots and strokes. Will check FSH, CBC, CMP, lipid panel, TSH, UA. Reviewed importance of continuing regular exercise routine, calcium rich diet, vitamin D 1000 daily encouraged. SBE's, annual screening mammogram, 3-D tomography reviewed and encouraged history of dense breasts, has scheduled.    Huel Cote [facility], 2:53 PM 11/05/2015

## 2015-11-05 NOTE — Patient Instructions (Signed)

## 2015-11-06 LAB — URINALYSIS W MICROSCOPIC + REFLEX CULTURE
BILIRUBIN URINE: NEGATIVE
Bacteria, UA: NONE SEEN [HPF]
Casts: NONE SEEN [LPF]
Crystals: NONE SEEN [HPF]
GLUCOSE, UA: NEGATIVE
Hgb urine dipstick: NEGATIVE
LEUKOCYTES UA: NEGATIVE
Nitrite: NEGATIVE
Protein, ur: NEGATIVE
RBC / HPF: NONE SEEN RBC/HPF (ref <=2)
SPECIFIC GRAVITY, URINE: 1.018 (ref 1.001–1.035)
Squamous Epithelial / LPF: NONE SEEN [HPF] (ref <=5)
WBC UA: NONE SEEN WBC/HPF (ref <=5)
Yeast: NONE SEEN [HPF]
pH: 6.5 (ref 5.0–8.0)

## 2015-11-06 LAB — TSH: TSH: 0.38 mIU/L — ABNORMAL LOW

## 2015-11-07 ENCOUNTER — Other Ambulatory Visit: Payer: Self-pay | Admitting: Women's Health

## 2015-11-07 DIAGNOSIS — Z1329 Encounter for screening for other suspected endocrine disorder: Secondary | ICD-10-CM

## 2015-11-07 LAB — T4

## 2015-11-07 LAB — T3

## 2015-11-12 ENCOUNTER — Other Ambulatory Visit: Payer: BLUE CROSS/BLUE SHIELD

## 2015-11-19 ENCOUNTER — Other Ambulatory Visit: Payer: BLUE CROSS/BLUE SHIELD

## 2015-11-19 DIAGNOSIS — Z1329 Encounter for screening for other suspected endocrine disorder: Secondary | ICD-10-CM

## 2015-11-19 LAB — THYROID PANEL WITH TSH
FREE THYROXINE INDEX: 2.1 (ref 1.4–3.8)
T3 UPTAKE: 34 % (ref 22–35)
T4, Total: 6.1 ug/dL (ref 4.5–12.0)
TSH: 0.5 m[IU]/L

## 2015-11-24 ENCOUNTER — Ambulatory Visit (HOSPITAL_BASED_OUTPATIENT_CLINIC_OR_DEPARTMENT_OTHER)
Admission: RE | Admit: 2015-11-24 | Discharge: 2015-11-24 | Disposition: A | Discharge status: Home / Self Care | Payer: BLUE CROSS/BLUE SHIELD | Source: Ambulatory Visit | Attending: Women's Health | Admitting: Women's Health

## 2015-11-24 DIAGNOSIS — Z1231 Encounter for screening mammogram for malignant neoplasm of breast: Secondary | ICD-10-CM | POA: Insufficient documentation

## 2015-11-25 ENCOUNTER — Encounter: Payer: Self-pay | Admitting: Women's Health

## 2015-12-02 ENCOUNTER — Other Ambulatory Visit: Payer: Self-pay | Admitting: Women's Health

## 2015-12-05 ENCOUNTER — Other Ambulatory Visit: Payer: Self-pay | Admitting: Women's Health

## 2016-11-08 ENCOUNTER — Encounter: Payer: BLUE CROSS/BLUE SHIELD | Admitting: Women's Health
